# Patient Record
Sex: Male | Born: 1970 | Race: White | Hispanic: No | Marital: Married | Smoking: Never smoker
Health system: Southern US, Community
[De-identification: ages and names within clinical notes are randomized; demographics above are authoritative.]

## PROBLEM LIST (undated history)

## (undated) NOTE — ED Notes (Signed)
 Formatting of this note might be different from the original. Assumed care of pt from triage. Pt complaining of left flank pain since this morning, worsening approx 4 hours ago. Pt has had nausea and vomiting x2 hr. Pt has hx of kidney stones. Pt in no acute distress at this time. Pt A&Ox4. Call bell within reach. Daughter at bedside. IV and NS bolus initiated. Pain and nausea medications administered. Pt states his pain has improved 5 min after med admin. Electronically signed by Christian Lyle PARAS at 05/24/2016  9:28 PM EDT

## (undated) NOTE — ED Notes (Signed)
 Formatting of this note might be different from the original. Provider at bedside for dispo and follow up. Discharge plan reviewed and paperwork signed, pain level within manageable comfortable limits, IV removed, ambulatory to exit, gait steady, safety maintained.  Electronically signed by Christian Lyle PARAS at 05/24/2016 10:56 PM EDT

## (undated) NOTE — ED Notes (Signed)
 Formatting of this note might be different from the original. Dc home after instructions per PA.  Voiced no concerns or questions. Electronically signed by Adine Race E at 05/24/2016 10:59 PM EDT

## (undated) NOTE — ED Provider Notes (Signed)
 Formatting of this note is different from the original. Images from the original note were not included. [facility] EMERGENCY DEPARTMENT HISTORY AND PHYSICAL EXAM   Date of Service: 05/24/2016  Patient Name: Ricky Kennedy  Date of Birth: 04-19-1971 Medical Record Number: [id number]  History of Presenting Illness   Chief Complaint  Patient presents with  ? Flank Pain    pt ambulatory to triage with c/o L flank pain x this am; pt with hx of stones; pt states, I may have blood but I can't tell, and it feels like I have to pee all the time.; pt states N/V/D x 2 hours     History Provided By:  patient  Additional History:  Ricky Kennedy is a 1 y.o. male with PMhx significant for kidney stones who presents ambulatory to the ED with cc of 9/10 left flank pain and LLQ abdominal pain that began this morning but worsened over the past four-five hours. He reports additional sx of nausea and vomiting (x 2). He notes that he has a history of kidney stones and states that his sx today are similar. He denies fever.  There are no other complaints, changes or physical findings at this time.  Primary Care Provider: Phys Other, MD  Specialist:  Past History   Past Medical History:  No past medical history on file.   Past Surgical History:  No past surgical history on file.   Family History:  No family history on file.   Social History:  Social History  Substance Use Topics  ? Smoking status: Not on file  ? Smokeless tobacco: Not on file  ? Alcohol use Not on file    Allergies:  Allergies  Allergen Reactions  ? Codeine Hives    Review of Systems  Review of Systems  Constitutional: Negative for fatigue and fever.  HENT: Negative for congestion, ear pain and rhinorrhea.   Eyes: Negative for pain and redness.  Respiratory: Negative for cough and wheezing.   Cardiovascular: Negative for chest pain and palpitations.  Gastrointestinal: Positive for  abdominal pain, nausea and vomiting.  Genitourinary: Positive for flank pain. Negative for dysuria, frequency and urgency.  Musculoskeletal: Negative for back pain, neck pain and neck stiffness.  Skin: Negative for rash and wound.  Neurological: Negative for weakness, light-headedness, numbness and headaches.  All other systems reviewed and are negative.  Physical Exam Physical Exam  Constitutional: He is oriented to person, place, and time. He appears well-developed and well-nourished.  Non-toxic appearance. No distress.  HENT:  Head: Normocephalic and atraumatic. Head is without right periorbital erythema and without left periorbital erythema.  Right Ear: External ear normal.  Left Ear: External ear normal.  Nose: Nose normal.  Mouth/Throat: Uvula is midline. No trismus in the jaw.  Eyes: Conjunctivae and EOM are normal. Pupils are equal, round, and reactive to light. No scleral icterus.  Neck: Normal range of motion and full passive range of motion without pain.  Cardiovascular: Normal rate, regular rhythm and normal heart sounds.   Pulmonary/Chest: Effort normal and breath sounds normal. No accessory muscle usage. No tachypnea. No respiratory distress. He has no decreased breath sounds. He has no wheezes.  Abdominal: Soft. There is tenderness in the left lower quadrant. There is no rigidity and no guarding.  Musculoskeletal: Normal range of motion.  Positive for left flank tenderness   Neurological: He is alert and oriented to person, place, and time. He is not disoriented. No cranial nerve deficit or  sensory deficit. GCS eye subscore is 4. GCS verbal subscore is 5. GCS motor subscore is 6.  Skin: Skin is intact. No rash noted.  Psychiatric: He has a normal mood and affect. His speech is normal.  Nursing note and vitals reviewed.  Medical Decision Making  I am the first provider for this patient.   I reviewed the vital signs, available nursing notes, past medical history, past  surgical history, family history and social history.   Old Medical Records: Old medical records. Nursing notes.   Provider Notes:  DDx: UTI, pyelonephritis, kidney stones, acute kidney injury  ED Course: 9:29 PM  Initial assessment performed. The patients presenting problems have been discussed, and they are in agreement with the care plan formulated and outlined with them.  I have encouraged them to ask questions as they arise throughout their visit.  Progress Notes:  10:53 PM The pt has been tolerating PO (water) throughout his stay in the ED tonight.   10:54 PM Ricky Kennedy's final results have been reviewed with him.  He has been counseled regarding his diagnosis.  He verbally conveys understanding and agreement of the signs, symptoms, diagnosis, treatment and prognosis .   Diagnostic Study Results  Labs -   Recent Results (from the past 12 hour(s))  URINALYSIS W/ REFLEX CULTURE   Collection Time: 05/24/16  7:54 PM  Result Value Ref Range   Color YELLOW/STRAW     Appearance CLOUDY (A) CLEAR     Specific gravity 1.018 1.003 - 1.030     pH (UA) 7.0 5.0 - 8.0     Protein 30 (A) NEG mg/dL   Glucose NEGATIVE  NEG mg/dL   Ketone NEGATIVE  NEG mg/dL   Bilirubin NEGATIVE  NEG     Blood LARGE (A) NEG     Urobilinogen 0.2 0.2 - 1.0 EU/dL   Nitrites NEGATIVE  NEG     Leukocyte Esterase NEGATIVE  NEG     WBC 0-4 0 - 4 /hpf   RBC >100 (H) 0 - 5 /hpf   Epithelial cells FEW FEW /lpf   Bacteria NEGATIVE  NEG /hpf   UA:UC IF INDICATED CULTURE NOT INDICATED BY UA RESULT CNI     Hyaline cast 0-2 0 - 5 /lpf  CBC WITH AUTOMATED DIFF   Collection Time: 05/24/16  8:09 PM  Result Value Ref Range   WBC 15.9 (H) 4.1 - 11.1 K/uL   RBC 4.55 4.10 - 5.70 M/uL   HGB 13.7 12.1 - 17.0 g/dL   HCT 60.6 63.3 - 49.6 %   MCV 86.4 80.0 - 99.0 FL   MCH 30.1 26.0 - 34.0 PG   MCHC 34.9 30.0 - 36.5 g/dL   RDW 87.2 88.4 - 85.4 %   PLATELET 240 150 - 400 K/uL   NEUTROPHILS 91 (H) 32 - 75 %    LYMPHOCYTES 6 (L) 12 - 49 %   MONOCYTES 3 (L) 5 - 13 %   EOSINOPHILS 0 0 - 7 %   BASOPHILS 0 0 - 1 %   ABS. NEUTROPHILS 14.5 (H) 1.8 - 8.0 K/UL   ABS. LYMPHOCYTES 0.9 0.8 - 3.5 K/UL   ABS. MONOCYTES 0.5 0.0 - 1.0 K/UL   ABS. EOSINOPHILS 0.0 0.0 - 0.4 K/UL   ABS. BASOPHILS 0.0 0.0 - 0.1 K/UL  METABOLIC PANEL, COMPREHENSIVE   Collection Time: 05/24/16  8:09 PM  Result Value Ref Range   Sodium 139 136 - 145 mmol/L   Potassium 3.6 3.5 -  5.1 mmol/L   Chloride 106 97 - 108 mmol/L   CO2 26 21 - 32 mmol/L   Anion gap 7 5 - 15 mmol/L   Glucose 116 (H) 65 - 100 mg/dL   BUN 18 6 - 20 MG/DL   Creatinine 8.65 (H) 9.29 - 1.30 MG/DL   BUN/Creatinine ratio 13 12 - 20     GFR est AA >60 >60 ml/min/1.94m2   GFR est non-AA 57 (L) >60 ml/min/1.80m2   Calcium 8.7 8.5 - 10.1 MG/DL   Bilirubin, total 0.4 0.2 - 1.0 MG/DL   ALT (SGPT) 95 (H) 12 - 78 U/L   AST (SGOT) 61 (H) 15 - 37 U/L   Alk. phosphatase 90 45 - 117 U/L   Protein, total 8.6 (H) 6.4 - 8.2 g/dL   Albumin 4.6 3.5 - 5.0 g/dL   Globulin 4.0 2.0 - 4.0 g/dL   A-G Ratio 1.2 1.1 - 2.2     Radiologic Studies - The following have been ordered and reviewed: CT ABD PELV WO CONT  Final Result    CT Results  (Last 48 hours)    05/24/16 2210  CT ABD PELV WO CONT Final result   Impression:  IMPRESSION:   1. Left hydroureteronephrosis with a 5.5 mm calculus in the distal left ureter.  2. Bilateral nephrolithiasis.  3. Hepatic steatosis.     Narrative:  EXAM:  CT ABD PELV WO CONT   INDICATION: Flank pain, stone disease suspected   COMPARISON: None   CONTRAST:  None.   TECHNIQUE:   Thin axial images were obtained through the abdomen and pelvis. Coronal and  sagittal reconstructions were generated. Oral contrast was not administered. CT  dose reduction was achieved through use of a standardized protocol tailored for  this examination and automatic exposure control for dose modulation.    The absence of intravenous contrast material  reduces the sensitivity for  evaluation of the solid parenchymal organs of the abdomen.    FINDINGS:   LUNG BASES: Mild hypoventilatory changes.  INCIDENTALLY IMAGED HEART AND MEDIASTINUM: Unremarkable.  LIVER: Hepatic steatosis.  GALLBLADDER: Unremarkable.  SPLEEN: No mass.  PANCREAS: No mass or ductal dilatation.  ADRENALS: Unremarkable.  KIDNEYS/URETERS: Bilateral nephrolithiasis. Left hydroureteronephrosis with a  5.5 mm calculus in the distal left ureter.  STOMACH: Unremarkable.  SMALL BOWEL: No dilatation or wall thickening.  COLON: No dilatation or wall thickening.  APPENDIX: Unremarkable.  PERITONEUM: No ascites or pneumoperitoneum.  RETROPERITONEUM: No lymphadenopathy or aortic aneurysm.  REPRODUCTIVE ORGANS: Normal.  URINARY BLADDER: No mass or calculus.  BONES: No destructive bone lesion.  ADDITIONAL COMMENTS: N/A      Vital Signs-Reviewed the patient's vital signs.  Patient Vitals for the past 12 hrs:  Temp Pulse Resp BP SpO2  05/24/16 1943 97.1 F (36.2 C) (!) 50 18 (!) 154/91 99 %   Medications Given in the ED: Medications  HYDROmorphone (PF) (DILAUDID) injection 1 mg (1 mg IntraVENous Given 05/24/16 2119)  ketorolac  (TORADOL ) injection 30 mg (30 mg IntraVENous Given 05/24/16 2119)  ondansetron (ZOFRAN) injection 4 mg (4 mg IntraVENous Given 05/24/16 2118)  sodium chloride  0.9 % bolus infusion 1,000 mL (0 mL IntraVENous IV Completed 05/24/16 2224)   Diagnosis: Clinical Impression:  1. Ureterolithiasis   2. Impaired renal function     Plan: 1:  Follow-up Information    Follow up With Details Comments Contact Info   Camellia SHAUNNA Olp, MD Schedule an appointment as soon as possible for a visit UROLOGY: call to  schedule follow up [address] [place] [address] [phone]    2:  Discharge Medication List as of 05/24/2016 10:56 PM   START taking these medications   Details  oxyCODONE -acetaminophen  (PERCOCET) 5-325 mg per tablet  Take 1 Tab by mouth every four (4) hours as needed for Pain. Max Daily Amount: 6 Tabs., Print, Disp-20 Tab, R-0   ibuprofen (MOTRIN) 800 mg tablet Take 1 Tab by mouth every eight (8) hours as needed for Pain for up to 7 days., Print, Disp-20 Tab, R-0   ondansetron (ZOFRAN ODT) 4 mg disintegrating tablet Take 1 Tab by mouth every eight (8) hours as needed for Nausea., Print, Disp-20 Tab, R-0   tamsulosin (FLOMAX) 0.4 mg capsule Take 1 Cap by mouth daily for 7 doses., Print, Disp-7 Cap, R-0     Return to ED if worse.   Disposition: DISCHARGE NOTE 10:54 PM The patient has been re-evaluated and is ready for discharge. Reviewed available results with patient. Counseled pt on diagnosis and care plan. Pt has expressed understanding, and all questions have been answered. Pt agrees with plan and agrees to F/U as recommended, or return to the ED if their sxs worsen. Discharge instructions have been provided and explained to the pt, along with reasons to return to the ED.  This note is prepared by Simmie Bathe, acting as Scribe for [employer].  PA-C Artist Molt: The scribe's documentation has been prepared under my direction and personally reviewed by me in its entirety. I confirm that the note above accurately reflects all work, treatment, procedures, and medical decision making performed by me.   Electronically signed by Holli Delon CROME., MD at 05/25/2016  1:15 AM EDT  Associated attestation - Termeer, Delon CROME., MD - 05/25/2016  1:15 AM EDT Formatting of this note might be different from the original. 1:15 AM I was personally available for consultation in the emergency department.  I have reviewed the chart and agree with the documentation recorded by the [facility], including the assessment, treatment plan, and disposition. Jennifer L. Termeer, MD

---

## 2014-08-03 NOTE — ED Provider Notes (Signed)
 [facility] EMERGENCY DEPARTMENT  (K59.00) Constipation, unspecified constipation type  Assessment/Differential Diagnosis:  Likely rectal impaction. Pressure in rectal areas. No BMs, on narcotic for pain from kidney stone. Soft stool impaction on exam.   ED Course/Medical Decision Making:  12:43 AM Pt had BM. Feels much improved.   SABRA  .Pre-Hospital/Procedures/Consults:  None  .Disposition:  Home  New Prescriptions   No medications on file   Chief Complaint  Patient presents with  . RECTAL PAIN    HPI Comments: Ricky Kennedy is a 43 y.o. male who presents to ED c/o constipation, rectal pressure, blood per rectum, low abd cramping since 6:30 PM tonight. Last nl BM was yesterday, sts he has difficulty with BMs for the last 10 days. At 6:30 PM today pt noted onset of sensation of needing to have BM with rectal pressure but was unable to have BM - sts he remained on the commode for about 3 hours attempting to have BM with no results. Has had blood per rectum in the last hour and admits to diffuse lower abd cramping. Pt also describes sensation of something out of me that's never been out of me before. Pt passed kidney stone recently, on 07/29/2014 had onset of neck pain and stiffness and began taking narcotic rx leftover from kidney stone for this. Pt tried drinking epsom salt, OTC stool softeners with no improvement. Today pt ate ham, [place], bread, cereal, coffee, ice cream. Denies nausea, vomiting, fevers, any other sxs. No h/o similar.   The history is provided by the patient. No language interpreter was used.    Review of Systems: Constitutional: Negative for fever and diaphoresis.  HENT: Negative for neck pain and neck stiffness.   Respiratory: Negative for cough and shortness of breath.   Cardiovascular: Negative for chest pain and palpitations.  Gastrointestinal: Positive for abdominal pain and constipation. Negative for nausea and vomiting.       +rectal pressure. +blood  per rectum  Musculoskeletal: Negative for myalgias and back pain.  Skin: Negative for color change and rash.  Neurological: Negative for dizziness, weakness and headaches.  Psychiatric/Behavioral: Negative for substance abuse. The patient is not nervous/anxious.   All other systems reviewed and are negative.   Physical Exam  Constitutional: He is well-developed, well-nourished, and in no distress.  HENT:  Mouth/Throat: Oropharynx is clear and moist.  Eyes: Pupils are equal, round, and reactive to light. No scleral icterus.  Neck: Normal range of motion. Neck supple.  Cardiovascular: Normal rate, regular rhythm and intact distal pulses.  Exam reveals no gallop and no friction rub.   No murmur heard. Pulmonary/Chest: Effort normal and breath sounds normal.  Abdominal: Soft. Bowel sounds are normal. He exhibits no mass. There is tenderness (mild lower).  Genitourinary:  Large amount of soft stool in rectum  Musculoskeletal: He exhibits no edema or tenderness.  Lymphadenopathy:    He has no cervical adenopathy.  Neurological: He is alert. He exhibits normal muscle tone.  Skin: Skin is warm and dry. No rash noted.  Nursing note and vitals reviewed.   Past Medical History  Diagnosis Date  . Kidney stone    History reviewed. No pertinent past surgical history. No family history on file. History   Social History  . Marital Status: Married    Spouse Name: N/A    Number of Children: N/A  . Years of Education: N/A   Occupational History  . Not on file.   Social History Main Topics  . Smoking status:  Never Smoker   . Smokeless tobacco: Never Used  . Alcohol Use: No  . Drug Use: No  . Sexual Activity: Not on file   Other Topics Concern  . Not on file   Social History Narrative   No outpatient prescriptions have been marked as taking for the 08/03/14 encounter [facility] Encounter).   Allergies  Allergen Reactions  . Codeine mild rash/itching    Vital Signs: Patient  Vitals for the past 72 hrs:  Temp Heart Rate Resp BP BP Mean SpO2 Weight  08/03/14 2259 98.1 F (36.7 C) 66 15 166/110 mmHg 129 MM HG 99 % 90.719 kg (200 lb)     Documentation Review:  Old medical records, Nursing notes  Diagnostics: Labs:  No results found for this visit on 08/03/14. ECG: No results found for this visit on 08/03/14.  Rhythm interpretation from monitor: N/A  No orders to display   This chart is partially documented by Chiquita FORBES Sharps acting as a scribe for Clotilda CHRISTELLA Prows, MD.

## 2015-02-26 ENCOUNTER — Emergency Department (HOSPITAL_COMMUNITY): Payer: BLUE CROSS/BLUE SHIELD

## 2015-02-26 ENCOUNTER — Encounter (HOSPITAL_COMMUNITY): Payer: Self-pay | Admitting: Emergency Medicine

## 2015-02-26 ENCOUNTER — Emergency Department (HOSPITAL_COMMUNITY)
Admission: EM | Admit: 2015-02-26 | Discharge: 2015-02-26 | Disposition: A | Discharge status: Home / Self Care | Payer: BLUE CROSS/BLUE SHIELD | Attending: Emergency Medicine | Admitting: Emergency Medicine

## 2015-02-26 DIAGNOSIS — Y998 Other external cause status: Secondary | ICD-10-CM | POA: Insufficient documentation

## 2015-02-26 DIAGNOSIS — Y92481 Parking lot as the place of occurrence of the external cause: Secondary | ICD-10-CM | POA: Insufficient documentation

## 2015-02-26 DIAGNOSIS — S9305XA Dislocation of left ankle joint, initial encounter: Secondary | ICD-10-CM | POA: Diagnosis not present

## 2015-02-26 DIAGNOSIS — S82852A Displaced trimalleolar fracture of left lower leg, initial encounter for closed fracture: Secondary | ICD-10-CM | POA: Diagnosis not present

## 2015-02-26 DIAGNOSIS — Y9389 Activity, other specified: Secondary | ICD-10-CM | POA: Diagnosis not present

## 2015-02-26 DIAGNOSIS — S99912A Unspecified injury of left ankle, initial encounter: Secondary | ICD-10-CM | POA: Diagnosis present

## 2015-02-26 DIAGNOSIS — R52 Pain, unspecified: Secondary | ICD-10-CM

## 2015-02-26 MED ORDER — SODIUM CHLORIDE 0.9 % IV SOLN
INTRAVENOUS | Status: DC | PRN
  Administered 2015-02-26: 125 mL/h via INTRAVENOUS

## 2015-02-26 MED ORDER — PROPOFOL 10 MG/ML IV BOLUS
INTRAVENOUS | Status: DC | PRN
  Administered 2015-02-26: 40 mg via INTRAVENOUS

## 2015-02-26 MED ORDER — OXYCODONE-ACETAMINOPHEN 5-325 MG PO TABS: 1.0000 | ORAL_TABLET | ORAL | Status: AC | PRN

## 2015-02-26 MED ORDER — NAPROXEN 500 MG PO TABS: ORAL_TABLET | ORAL | Status: AC

## 2015-02-26 MED ORDER — FENTANYL CITRATE (PF) 100 MCG/2ML IJ SOLN
50.0000 ug | Freq: Once | INTRAMUSCULAR | Status: AC
  Administered 2015-02-26: 50 ug via INTRAVENOUS
  Filled 2015-02-26: qty 2

## 2015-02-26 MED ORDER — FENTANYL CITRATE (PF) 100 MCG/2ML IJ SOLN
50.0000 ug | Freq: Once | INTRAMUSCULAR | Status: DC
  Filled 2015-02-26: qty 2

## 2015-02-26 MED ORDER — PROPOFOL 10 MG/ML IV BOLUS
200.0000 mg | Freq: Once | INTRAVENOUS | Status: DC
  Filled 2015-02-26: qty 20

## 2015-02-26 MED ORDER — FENTANYL CITRATE (PF) 100 MCG/2ML IJ SOLN
50.0000 ug | Freq: Once | INTRAMUSCULAR | Status: AC
  Administered 2015-02-26: 50 ug via INTRAVENOUS

## 2015-02-26 MED ORDER — KETOROLAC TROMETHAMINE 30 MG/ML IJ SOLN
30.0000 mg | Freq: Once | INTRAMUSCULAR | Status: AC
  Administered 2015-02-26: 30 mg via INTRAVENOUS
  Filled 2015-02-26: qty 1

## 2015-02-26 NOTE — Discharge Instructions (Signed)
Elevate your foot. Use ice packs to keep the swelling down. Do not bear weight on your left leg. Use the crutches to walk. Leave the splint in place until you see your orthopedist at home. Call his office on Monday. You had a "trimalleolar fracture/dislocation" of your left ankle and you will need surgery to have it repaired properly. Take the CD of your xrays with you when you go to see him.  Cryotherapy Cryotherapy means treatment with cold. Ice or gel packs can be used to reduce both pain and swelling. Ice is the most helpful within the first 24 to 48 hours after an injury or flare-up from overusing a muscle or joint. Sprains, strains, spasms, burning pain, shooting pain, and aches can all be eased with ice. Ice can also be used when recovering from surgery. Ice is effective, has very few side effects, and is safe for most people to use. PRECAUTIONS  Ice is not a safe treatment option for people with:  Raynaud phenomenon. This is a condition affecting small blood vessels in the extremities. Exposure to cold may cause your problems to return.  Cold hypersensitivity. There are many forms of cold hypersensitivity, including:  Cold urticaria. Red, itchy hives appear on the skin when the tissues begin to warm after being iced.  Cold erythema. This is a red, itchy rash caused by exposure to cold.  Cold hemoglobinuria. Red blood cells break down when the tissues begin to warm after being iced. The hemoglobin that carry oxygen are passed into the urine because they cannot combine with blood proteins fast enough.  Numbness or altered sensitivity in the area being iced. If you have any of the following conditions, do not use ice until you have discussed cryotherapy with your caregiver:  Heart conditions, such as arrhythmia, angina, or chronic heart disease.  High blood pressure.  Healing wounds or open skin in the area being iced.  Current infections.  Rheumatoid arthritis.  Poor  circulation.  Diabetes. Ice slows the blood flow in the region it is applied. This is beneficial when trying to stop inflamed tissues from spreading irritating chemicals to surrounding tissues. However, if you expose your skin to cold temperatures for too long or without the proper protection, you can damage your skin or nerves. Watch for signs of skin damage due to cold. HOME CARE INSTRUCTIONS Follow these tips to use ice and cold packs safely.  Place a dry or damp towel between the ice and skin. A damp towel will cool the skin more quickly, so you may need to shorten the time that the ice is used.  For a more rapid response, add gentle compression to the ice.  Ice for no more than 10 to 20 minutes at a time. The bonier the area you are icing, the less time it will take to get the benefits of ice.  Check your skin after 5 minutes to make sure there are no signs of a poor response to cold or skin damage.  Rest 20 minutes or more between uses.  Once your skin is numb, you can end your treatment. You can test numbness by very lightly touching your skin. The touch should be so light that you do not see the skin dimple from the pressure of your fingertip. When using ice, most people will feel these normal sensations in this order: cold, burning, aching, and numbness.  Do not use ice on someone who cannot communicate their responses to pain, such as small children or  people with dementia. HOW TO MAKE AN ICE PACK Ice packs are the most common way to use ice therapy. Other methods include ice massage, ice baths, and cryosprays. Muscle creams that cause a cold, tingly feeling do not offer the same benefits that ice offers and should not be used as a substitute unless recommended by your caregiver. To make an ice pack, do one of the following:  Place crushed ice or a bag of frozen vegetables in a sealable plastic bag. Squeeze out the excess air. Place this bag inside another plastic bag. Slide the bag  into a pillowcase or place a damp towel between your skin and the bag.  Mix 3 parts water with 1 part rubbing alcohol. Freeze the mixture in a sealable plastic bag. When you remove the mixture from the freezer, it will be slushy. Squeeze out the excess air. Place this bag inside another plastic bag. Slide the bag into a pillowcase or place a damp towel between your skin and the bag. SEEK MEDICAL CARE IF:  You develop white spots on your skin. This may give the skin a blotchy (mottled) appearance.  Your skin turns blue or pale.  Your skin becomes waxy or hard.  Your swelling gets worse. MAKE SURE YOU:   Understand these instructions.  Will watch your condition.  Will get help right away if you are not doing well or get worse. Document Released: 03/18/2011 Document Revised: 12/06/2013 Document Reviewed: 03/18/2011 [facility] Patient Information 2015 [place], [place]. This information is not intended to replace advice given to you by your health care provider. Make sure you discuss any questions you have with your health care provider.  Cast or Splint Care Casts and splints support injured limbs and keep bones from moving while they heal.  HOME CARE  Keep the cast or splint uncovered during the drying period.  A plaster cast can take 24 to 48 hours to dry.  A fiberglass cast will dry in less than 1 hour.  Do not rest the cast on anything harder than a pillow for 24 hours.  Do not put weight on your injured limb. Do not put pressure on the cast. Wait for your doctor's approval.  Keep the cast or splint dry.  Cover the cast or splint with a plastic bag during baths or wet weather.  If you have a cast over your chest and belly (trunk), take sponge baths until the cast is taken off.  If your cast gets wet, dry it with a towel or blow dryer. Use the cool setting on the blow dryer.  Keep your cast or splint clean. Wash a dirty cast with a damp cloth.  Do not put any objects under  your cast or splint.  Do not scratch the skin under the cast with an object. If itching is a problem, use a blow dryer on a cool setting over the itchy area.  Do not trim or cut your cast.  Do not take out the padding from inside your cast.  Exercise your joints near the cast as told by your doctor.  Raise (elevate) your injured limb on 1 or 2 pillows for the first 1 to 3 days. GET HELP IF:  Your cast or splint cracks.  Your cast or splint is too tight or too loose.  You itch badly under the cast.  Your cast gets wet or has a soft spot.  You have a bad smell coming from the cast.  You get an object stuck  under the cast.  Your skin around the cast becomes red or sore.  You have new or more pain after the cast is put on. GET HELP RIGHT AWAY IF:  You have fluid leaking through the cast.  You cannot move your fingers or toes.  Your fingers or toes turn blue or white or are cool, painful, or puffy (swollen).  You have tingling or lose feeling (numbness) around the injured area.  You have bad pain or pressure under the cast.  You have trouble breathing or have shortness of breath.  You have chest pain. Document Released: 11/21/2010 Document Revised: 03/24/2013 Document Reviewed: 01/28/2013 [facility] Patient Information 2015 [place], [place]. This information is not intended to replace advice given to you by your health care provider. Make sure you discuss any questions you have with your health care provider.  Ankle Fracture A fracture is a break in a bone. The ankle joint is made up of three bones. These include the lower (distal)sections of your lower leg bones, called the tibia and fibula, along with a bone in your foot, called the talus. Depending on how bad the break is and if more than one ankle joint bone is broken, a cast or splint is used to protect and keep your injured bone from moving while it heals. Sometimes, surgery is required to help the fracture heal  properly.  There are two general types of fractures:  Stable fracture. This includes a single fracture line through one bone, with no injury to ankle ligaments. A fracture of the talus that does not have any displacement (movement of the bone on either side of the fracture line) is also stable.  Unstable fracture. This includes more than one fracture line through one or more bones in the ankle joint. It also includes fractures that have displacement of the bone on either side of the fracture line. CAUSES  A direct blow to the ankle.   Quickly and severely twisting your ankle.  Trauma, such as a car accident or falling from a significant height. RISK FACTORS You may be at a higher risk of ankle fracture if:  You have certain medical conditions.  You are involved in high-impact sports.  You are involved in a high-impact car accident. SIGNS AND SYMPTOMS   Tender and swollen ankle.  Bruising around the injured ankle.  Pain on movement of the ankle.  Difficulty walking or putting weight on the ankle.  A cold foot below the site of the ankle injury. This can occur if the blood vessels passing through your injured ankle were also damaged.  Numbness in the foot below the site of the ankle injury. DIAGNOSIS  An ankle fracture is usually diagnosed with a physical exam and X-rays. A CT scan may also be required for complex fractures. TREATMENT  Stable fractures are treated with a cast or splint and using crutches to avoid putting weight on your injured ankle. This is followed by an ankle strengthening program. Some patients require a special type of cast, depending on other medical problems they may have. Unstable fractures require surgery to ensure the bones heal properly. Your health care provider will tell you what type of fracture you have and the best treatment for your condition. HOME CARE INSTRUCTIONS   Review correct crutch use with your health care provider and use your  crutches as directed. Safe use of crutches is extremely important. Misuse of crutches can cause you to fall or cause injury to nerves in your hands or  armpits.  Do not put weight or pressure on the injured ankle until directed by your health care provider.  To lessen the swelling, keep the injured leg elevated while sitting or lying down.  Apply ice to the injured area:  Put ice in a plastic bag.  Place a towel between your cast and the bag.  Leave the ice on for 20 minutes, 2-3 times a day.  If you have a plaster or fiberglass cast:  Do not try to scratch the skin under the cast with any objects. This can increase your risk of skin infection.  Check the skin around the cast every day. You may put lotion on any red or sore areas.  Keep your cast dry and clean.  If you have a plaster splint:  Wear the splint as directed.  You may loosen the elastic around the splint if your toes become numb, tingle, or turn cold or blue.  Do not put pressure on any part of your cast or splint; it may break. Rest your cast only on a pillow the first 24 hours until it is fully hardened.  Your cast or splint can be protected during bathing with a plastic bag sealed to your skin with medical tape. Do not lower the cast or splint into water.  Take medicines as directed by your health care provider. Only take over-the-counter or prescription medicines for pain, discomfort, or fever as directed by your health care provider.  Do not drive a vehicle until your health care provider specifically tells you it is safe to do so.  If your health care provider has given you a follow-up appointment, it is very important to keep that appointment. Not keeping the appointment could result in a chronic or permanent injury, pain, and disability. If you have any problem keeping the appointment, call the facility for assistance. SEEK MEDICAL CARE IF: You develop increased swelling or discomfort. SEEK IMMEDIATE MEDICAL  CARE IF:   Your cast gets damaged or breaks.  You have continued severe pain.  You develop new pain or swelling after the cast was put on.  Your skin or toenails below the injury turn blue or gray.  Your skin or toenails below the injury feel cold, numb, or have loss of sensitivity to touch.  There is a bad smell or pus draining from under the cast. MAKE SURE YOU:   Understand these instructions.  Will watch your condition.  Will get help right away if you are not doing well or get worse. Document Released: 07/19/2000 Document Revised: 07/27/2013 Document Reviewed: 02/18/2013 [facility] Patient Information 2015 [place], [place]. This information is not intended to replace advice given to you by your health care provider. Make sure you discuss any questions you have with your health care provider.  Ankle Dislocation Ankle dislocation is the displacement of the bones that form your ankle joint. The ankle joint is designed for a balance of stability and flexibility. The bones of the ankle are held in place by very strong, fibrous tissues (ligaments) that connect the bones to each other. CAUSES Because the ankle is a very strong and stable joint, ankle dislocation is only caused by a very forceful injury. Typically, injuries that contribute to ankle dislocation include broken bones (fractures) on the inside and outside of the ankle (malleoli).  RELATED COMPLICATIONS Ankle dislocation can lead to more serious complications. Examples of complications associated with ankle dislocation include:  Injury to the strong fibrous tissues that connect muscles to bones (tendons).  Injury to the flexible tissue that cushions the bones in the joint (cartilage). This can lead to the development of arthritis, loss of joint motion, and pain.  Injury to the nerves and blood vessels that cross the ankle. Blood vessel damage may result in bone death of the top bone of the foot (talus).  Skin over the  dislocated area being torn (lacerated) or damaged by pressure from the dislocated bones.  Swelling of compartments in the foot (rare). This may damage blood supply to the muscles (compartment syndrome). RISK FACTORS Although dislocation of the ankle can occur in anyone, some people are at greater risk than others. People at increased risk of ankle dislocation include:  Young males. This may be related to their overall increased risk of injury.  Postmenopausal women. This may be related to their increased risk of bone fracture because of the weakening of the bones that occurs in women in this age group (osteoporosis).  People born with greater looseness (elasticity) in their ligaments. SYMPTOMS Symptoms of ankle dislocation include:  Severe pain.  Swelling.  Deformity around the ankle.  Whitening or laceration of the skin. DIAGNOSIS  A physical exam and an X-ray exam are usually done to help your caregiver diagnose ankle dislocation. TREATMENT Treatment may include:  Manipulation of the ankle by your caregiver to put your ankle back in place (reduction).  Repair of any associated skin lacerations.  Plates and screws used to stabilize the fractures and hold the joint in position after reduction.  Pins drilled into your bones that are connected to bars outside of your skin (external fixator) used to hold your ankle in a fixed position until the swelling in your ankle goes down enough for surgery to be done.  Placement of a cast or splint to allow torn ligaments to heal.  Physical therapy to regain ankle motion and leg strength. HOME CARE INSTRUCTIONS The following measures can help to reduce pain and hasten the healing process:  Rest your injured joint. Do not move it. Avoid activities similar to the one that caused your injury.  Apply ice to your injured joint for 1 to 2 days after your reduction or as directed by your caregiver. Applying ice helps to reduce inflammation and  pain.  Put ice in a plastic bag.  Place a towel between your skin and the bag.  Leave the ice on for 15 to 20 minutes at a time, every couple of hours while you are awake.  Elevate your ankle above your heart to minimize swelling.  Move your toes as instructed by your caregiver to prevent stiffness.  Take over-the-counter or prescription medicines for pain as directed by your caregiver. SEEK IMMEDIATE MEDICAL CARE IF:  Your cast, splint, screws, plates, or external fixator becomes loose or damaged.  You have an external fixator and you notice fluids draining around the pins.  Your pain becomes worse rather than better.  You lose feeling in your toe or cannot bend the tip of your toe. MAKE SURE YOU:  Understand these instructions.  Will watch your condition.  Will get help right away if you are not doing well or get worse. Document Released: 07/22/2005 Document Revised: 10/14/2011 Document Reviewed: 12/20/2010 [facility] Patient Information 2015 [place], [place]. This information is not intended to replace advice given to you by your health care provider. Make sure you discuss any questions you have with your health care provider.
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
## 2016-05-24 ENCOUNTER — Inpatient Hospital Stay
Admit: 2016-05-24 | Discharge: 2016-05-25 | Disposition: A | Discharge status: Home / Self Care | Payer: PRIVATE HEALTH INSURANCE | Attending: Emergency Medicine

## 2016-05-24 ENCOUNTER — Emergency Department: Admit: 2016-05-25 | Payer: PRIVATE HEALTH INSURANCE | Primary: Family Medicine

## 2016-05-24 DIAGNOSIS — N201 Calculus of ureter: Secondary | ICD-10-CM

## 2016-05-24 NOTE — ED Provider Notes (Signed)
[facility]  EMERGENCY DEPARTMENT HISTORY AND PHYSICAL EXAM         Date of Service: 05/24/2016   Patient Name: Jeffrey Cannon   Date of Birth: 1971/04/14  Medical Record Number: [id number]    History of Presenting Illness     Chief Complaint   Patient presents with   ??? Flank Pain     pt ambulatory to triage with c/o L flank pain x this am; pt with hx of stones; pt states, "I may have blood but I can't tell, and it feels like I have to pee all the time."; pt states N/V/D x 2 hours         History Provided By:  patient    Additional History:   Jeffrey Cannon is a 45 y.o. male with PMhx significant for kidney stones who presents ambulatory to the ED with cc of 9/10 left flank pain and LLQ abdominal pain that began this morning but worsened over the past four-five hours. He reports additional sx of nausea and vomiting (x 2). He notes that he has a history of kidney stones and states that his sx today are similar. He denies fever.    There are no other complaints, changes or physical findings at this time.    Primary Care Provider: Phys Other, MD   Specialist:    Past History     Past Medical History:   No past medical history on file.     Past Surgical History:   No past surgical history on file.     Family History:   No family history on file.     Social History:   Social History   Substance Use Topics   ??? Smoking status: Not on file   ??? Smokeless tobacco: Not on file   ??? Alcohol use Not on file        Allergies:   Allergies   Allergen Reactions   ??? Codeine Hives        Review of Systems   Review of Systems   Constitutional: Negative for fatigue and fever.   HENT: Negative for congestion, ear pain and rhinorrhea.    Eyes: Negative for pain and redness.   Respiratory: Negative for cough and wheezing.    Cardiovascular: Negative for chest pain and palpitations.   Gastrointestinal: Positive for abdominal pain, nausea and vomiting.    Genitourinary: Positive for flank pain. Negative for dysuria, frequency and urgency.   Musculoskeletal: Negative for back pain, neck pain and neck stiffness.   Skin: Negative for rash and wound.   Neurological: Negative for weakness, light-headedness, numbness and headaches.   All other systems reviewed and are negative.      Physical Exam  Physical Exam   Constitutional: He is oriented to person, place, and time. He appears well-developed and well-nourished.  Non-toxic appearance. No distress.   HENT:   Head: Normocephalic and atraumatic. Head is without right periorbital erythema and without left periorbital erythema.   Right Ear: External ear normal.   Left Ear: External ear normal.   Nose: Nose normal.   Mouth/Throat: Uvula is midline. No trismus in the jaw.   Eyes: Conjunctivae and EOM are normal. Pupils are equal, round, and reactive to light. No scleral icterus.   Neck: Normal range of motion and full passive range of motion without pain.   Cardiovascular: Normal rate, regular rhythm and normal heart sounds.    Pulmonary/Chest: Effort normal and breath sounds normal. No accessory  muscle usage. No tachypnea. No respiratory distress. He has no decreased breath sounds. He has no wheezes.   Abdominal: Soft. There is tenderness in the left lower quadrant. There is no rigidity and no guarding.   Musculoskeletal: Normal range of motion.   Positive for left flank tenderness    Neurological: He is alert and oriented to person, place, and time. He is not disoriented. No cranial nerve deficit or sensory deficit. GCS eye subscore is 4. GCS verbal subscore is 5. GCS motor subscore is 6.   Skin: Skin is intact. No rash noted.   Psychiatric: He has a normal mood and affect. His speech is normal.   Nursing note and vitals reviewed.      Medical Decision Making   I am the first provider for this patient.     I reviewed the vital signs, available nursing notes, past medical history,  past surgical history, family history and social history.     Old Medical Records: Old medical records.  Nursing notes.     Provider Notes:   DDx: UTI, pyelonephritis, kidney stones, acute kidney injury    ED Course:  9:29 PM   Initial assessment performed. The patients presenting problems have been discussed, and they are in agreement with the care plan formulated and outlined with them.  I have encouraged them to ask questions as they arise throughout their visit.    Progress Notes:   10:53 PM  The pt has been tolerating PO (water) throughout his stay in the ED tonight.     10:54 PM  Loretta Stanczyk's final results have been reviewed with him.  He has been counseled regarding his diagnosis.  He verbally conveys understanding and agreement of the signs, symptoms, diagnosis, treatment and prognosis .     Diagnostic Study Results   Labs -      Recent Results (from the past 12 hour(s))   URINALYSIS W/ REFLEX CULTURE    Collection Time: 05/24/16  7:54 PM   Result Value Ref Range    Color YELLOW/STRAW      Appearance CLOUDY (A) CLEAR      Specific gravity 1.018 1.003 - 1.030      pH (UA) 7.0 5.0 - 8.0      Protein 30 (A) NEG mg/dL    Glucose NEGATIVE  NEG mg/dL    Ketone NEGATIVE  NEG mg/dL    Bilirubin NEGATIVE  NEG      Blood LARGE (A) NEG      Urobilinogen 0.2 0.2 - 1.0 EU/dL    Nitrites NEGATIVE  NEG      Leukocyte Esterase NEGATIVE  NEG      WBC 0-4 0 - 4 /hpf    RBC >100 (H) 0 - 5 /hpf    Epithelial cells FEW FEW /lpf    Bacteria NEGATIVE  NEG /hpf    UA:UC IF INDICATED CULTURE NOT INDICATED BY UA RESULT CNI      Hyaline cast 0-2 0 - 5 /lpf   CBC WITH AUTOMATED DIFF    Collection Time: 05/24/16  8:09 PM   Result Value Ref Range    WBC 15.9 (H) 4.1 - 11.1 K/uL    RBC 4.55 4.10 - 5.70 M/uL    HGB 13.7 12.1 - 17.0 g/dL    HCT 39.3 36.6 - 50.3 %    MCV 86.4 80.0 - 99.0 FL    MCH 30.1 26.0 - 34.0 PG    MCHC 34.9 30.0 - 36.5 g/dL  RDW 12.7 11.5 - 14.5 %    PLATELET 240 150 - 400 K/uL    NEUTROPHILS 91 (H) 32 - 75 %     LYMPHOCYTES 6 (L) 12 - 49 %    MONOCYTES 3 (L) 5 - 13 %    EOSINOPHILS 0 0 - 7 %    BASOPHILS 0 0 - 1 %    ABS. NEUTROPHILS 14.5 (H) 1.8 - 8.0 K/UL    ABS. LYMPHOCYTES 0.9 0.8 - 3.5 K/UL    ABS. MONOCYTES 0.5 0.0 - 1.0 K/UL    ABS. EOSINOPHILS 0.0 0.0 - 0.4 K/UL    ABS. BASOPHILS 0.0 0.0 - 0.1 K/UL   METABOLIC PANEL, COMPREHENSIVE    Collection Time: 05/24/16  8:09 PM   Result Value Ref Range    Sodium 139 136 - 145 mmol/L    Potassium 3.6 3.5 - 5.1 mmol/L    Chloride 106 97 - 108 mmol/L    CO2 26 21 - 32 mmol/L    Anion gap 7 5 - 15 mmol/L    Glucose 116 (H) 65 - 100 mg/dL    BUN 18 6 - 20 MG/DL    Creatinine 1.34 (H) 0.70 - 1.30 MG/DL    BUN/Creatinine ratio 13 12 - 20      GFR est AA >60 >60 ml/min/1.51m    GFR est non-AA 57 (L) >60 ml/min/1.712m   Calcium 8.7 8.5 - 10.1 MG/DL    Bilirubin, total 0.4 0.2 - 1.0 MG/DL    ALT (SGPT) 95 (H) 12 - 78 U/L    AST (SGOT) 61 (H) 15 - 37 U/L    Alk. phosphatase 90 45 - 117 U/L    Protein, total 8.6 (H) 6.4 - 8.2 g/dL    Albumin 4.6 3.5 - 5.0 g/dL    Globulin 4.0 2.0 - 4.0 g/dL    A-G Ratio 1.2 1.1 - 2.2         Radiologic Studies -  The following have been ordered and reviewed:  CT ABD PELV WO CONT   Final Result        CT Results  (Last 48 hours)               05/24/16 2210  CT ABD PELV WO CONT Final result    Impression:  IMPRESSION:       1. Left hydroureteronephrosis with a 5.5 mm calculus in the distal left ureter.   2. Bilateral nephrolithiasis.   3. Hepatic steatosis.           Narrative:  EXAM:  CT ABD PELV WO CONT       INDICATION: Flank pain, stone disease suspected       COMPARISON: None       CONTRAST:  None.       TECHNIQUE:    Thin axial images were obtained through the abdomen and pelvis. Coronal and   sagittal reconstructions were generated. Oral contrast was not administered. CT   dose reduction was achieved through use of a standardized protocol tailored for   this examination and automatic exposure control for dose modulation.         The absence of intravenous contrast material reduces the sensitivity for   evaluation of the solid parenchymal organs of the abdomen.        FINDINGS:    LUNG BASES: Mild hypoventilatory changes.   INCIDENTALLY IMAGED HEART AND MEDIASTINUM: Unremarkable.   LIVER: Hepatic steatosis.   GALLBLADDER: Unremarkable.  SPLEEN: No mass.   PANCREAS: No mass or ductal dilatation.   ADRENALS: Unremarkable.   KIDNEYS/URETERS: Bilateral nephrolithiasis. Left hydroureteronephrosis with a   5.5 mm calculus in the distal left ureter.   STOMACH: Unremarkable.   SMALL BOWEL: No dilatation or wall thickening.   COLON: No dilatation or wall thickening.   APPENDIX: Unremarkable.   PERITONEUM: No ascites or pneumoperitoneum.   RETROPERITONEUM: No lymphadenopathy or aortic aneurysm.   REPRODUCTIVE ORGANS: Normal.   URINARY BLADDER: No mass or calculus.   BONES: No destructive bone lesion.   ADDITIONAL COMMENTS: N/A                 Vital Signs-Reviewed the patient's vital signs.   Patient Vitals for the past 12 hrs:   Temp Pulse Resp BP SpO2   05/24/16 1943 97.1 ??F (36.2 ??C) (!) 50 18 (!) 154/91 99 %       Medications Given in the ED:  Medications   HYDROmorphone (PF) (DILAUDID) injection 1 mg (1 mg IntraVENous Given 05/24/16 2119)   ketorolac (TORADOL) injection 30 mg (30 mg IntraVENous Given 05/24/16 2119)   ondansetron (ZOFRAN) injection 4 mg (4 mg IntraVENous Given 05/24/16 2118)   sodium chloride 0.9 % bolus infusion 1,000 mL (0 mL IntraVENous IV Completed 05/24/16 2224)       Diagnosis:  Clinical Impression:   1. Ureterolithiasis    2. Impaired renal function         Plan:  1:   Follow-up Information     Follow up With Details Comments [place], MD Schedule an appointment as soon as possible for a visit UROLOGY: call to schedule follow up [address]  [place] [address]  [phone]            2:   Discharge Medication List as of 05/24/2016 10:56 PM      START taking these medications     Details   oxyCODONE-acetaminophen (PERCOCET) 5-325 mg per tablet Take 1 Tab by mouth every four (4) hours as needed for Pain. Max Daily Amount: 6 Tabs., Print, Disp-20 Tab, R-0      ibuprofen (MOTRIN) 800 mg tablet Take 1 Tab by mouth every eight (8) hours as needed for Pain for up to 7 days., Print, Disp-20 Tab, R-0      ondansetron (ZOFRAN ODT) 4 mg disintegrating tablet Take 1 Tab by mouth every eight (8) hours as needed for Nausea., Print, Disp-20 Tab, R-0      tamsulosin (FLOMAX) 0.4 mg capsule Take 1 Cap by mouth daily for 7 doses., Print, Disp-7 Cap, R-0           Return to ED if worse.     Disposition:  DISCHARGE NOTE  10:54 PM  The patient has been re-evaluated and is ready for discharge. Reviewed available results with patient. Counseled pt on diagnosis and care plan. Pt has expressed understanding, and all questions have been answered. Pt agrees with plan and agrees to F/U as recommended, or return to the ED if their sxs worsen. Discharge instructions have been provided and explained to the pt, along with reasons to return to the ED.    This note is prepared by Hanley Hays, acting as Scribe for [employer].    PA-C Debbra Riding: The scribe's documentation has been prepared under my direction and personally reviewed by me in its entirety. I confirm that the note above accurately reflects all work,  treatment, procedures, and medical decision making performed by me.

## 2016-05-24 NOTE — ED Notes (Signed)
Assumed care of pt from triage. Pt complaining of left flank pain since this morning, worsening approx 4 hours ago. Pt has had nausea and vomiting x2 hr. Pt has hx of kidney stones. Pt in no acute distress at this time. Pt A&Ox4. Call bell within reach. Daughter at bedside. IV and NS bolus initiated. Pain and nausea medications administered. Pt states his pain has improved 5 min after med admin.

## 2016-05-24 NOTE — ED Notes (Signed)
Dc home after instructions per PA.  Voiced no concerns or questions.

## 2016-05-24 NOTE — ED Notes (Signed)
Provider at bedside for dispo and follow up. Discharge plan reviewed and paperwork signed, pain level within manageable comfortable limits, IV removed, ambulatory to exit, gait steady, safety maintained.

## 2016-05-25 LAB — CBC WITH AUTOMATED DIFF
ABS. BASOPHILS: 0 10*3/uL (ref 0.0–0.1)
ABS. EOSINOPHILS: 0 10*3/uL (ref 0.0–0.4)
ABS. LYMPHOCYTES: 0.9 10*3/uL (ref 0.8–3.5)
ABS. MONOCYTES: 0.5 10*3/uL (ref 0.0–1.0)
ABS. NEUTROPHILS: 14.5 10*3/uL — ABNORMAL HIGH (ref 1.8–8.0)
BASOPHILS: 0 % (ref 0–1)
EOSINOPHILS: 0 % (ref 0–7)
HCT: 39.3 % (ref 36.6–50.3)
HGB: 13.7 g/dL (ref 12.1–17.0)
LYMPHOCYTES: 6 % — ABNORMAL LOW (ref 12–49)
MCH: 30.1 PG (ref 26.0–34.0)
MCHC: 34.9 g/dL (ref 30.0–36.5)
MCV: 86.4 FL (ref 80.0–99.0)
MONOCYTES: 3 % — ABNORMAL LOW (ref 5–13)
NEUTROPHILS: 91 % — ABNORMAL HIGH (ref 32–75)
PLATELET: 240 10*3/uL (ref 150–400)
RBC: 4.55 M/uL (ref 4.10–5.70)
RDW: 12.7 % (ref 11.5–14.5)
WBC: 15.9 10*3/uL — ABNORMAL HIGH (ref 4.1–11.1)

## 2016-05-25 LAB — URINALYSIS W/ REFLEX CULTURE
Bacteria: NEGATIVE /hpf
Bilirubin: NEGATIVE
Glucose: NEGATIVE mg/dL
Ketone: NEGATIVE mg/dL
Leukocyte Esterase: NEGATIVE
Nitrites: NEGATIVE
Protein: 30 mg/dL — AB
RBC: >100 /hpf — ABNORMAL HIGH (ref 0–5)
Specific gravity: 1.018 (ref 1.003–1.030)
Urobilinogen: 0.2 EU/dL (ref 0.2–1.0)
pH (UA): 7 (ref 5.0–8.0)

## 2016-05-25 LAB — METABOLIC PANEL, COMPREHENSIVE
A-G Ratio: 1.2 (ref 1.1–2.2)
ALT (SGPT): 95 U/L — ABNORMAL HIGH (ref 12–78)
AST (SGOT): 61 U/L — ABNORMAL HIGH (ref 15–37)
Albumin: 4.6 g/dL (ref 3.5–5.0)
Alk. phosphatase: 90 U/L (ref 45–117)
Anion gap: 7 mmol/L (ref 5–15)
BUN/Creatinine ratio: 13 (ref 12–20)
BUN: 18 MG/DL (ref 6–20)
Bilirubin, total: 0.4 MG/DL (ref 0.2–1.0)
CO2: 26 mmol/L (ref 21–32)
Calcium: 8.7 MG/DL (ref 8.5–10.1)
Chloride: 106 mmol/L (ref 97–108)
Creatinine: 1.34 MG/DL — ABNORMAL HIGH (ref 0.70–1.30)
GFR est AA: >60 mL/min/{1.73_m2} (ref >60)
GFR est non-AA: 57 mL/min/{1.73_m2} — ABNORMAL LOW (ref >60)
Globulin: 4 g/dL (ref 2.0–4.0)
Glucose: 116 mg/dL — ABNORMAL HIGH (ref 65–100)
Potassium: 3.6 mmol/L (ref 3.5–5.1)
Protein, total: 8.6 g/dL — ABNORMAL HIGH (ref 6.4–8.2)
Sodium: 139 mmol/L (ref 136–145)

## 2016-05-25 MED ORDER — ONDANSETRON 4 MG TAB, RAPID DISSOLVE
4 mg | ORAL_TABLET | Freq: Three times a day (TID) | ORAL | 0 refills | Status: AC | PRN
Start: 2016-05-25 — End: ?

## 2016-05-25 MED ORDER — HYDROMORPHONE (PF) 1 MG/ML IJ SOLN
1 mg/mL | INTRAMUSCULAR | Status: AC
Start: 2016-05-25 — End: 2016-05-24
  Administered 2016-05-25: 01:00:00 via INTRAVENOUS

## 2016-05-25 MED ORDER — IBUPROFEN 800 MG TAB
800 mg | ORAL_TABLET | Freq: Three times a day (TID) | ORAL | 0 refills | Status: AC | PRN
Start: 2016-05-25 — End: 2016-05-31

## 2016-05-25 MED ORDER — OXYCODONE-ACETAMINOPHEN 5 MG-325 MG TAB
5-325 mg | ORAL_TABLET | ORAL | 0 refills | Status: AC | PRN
Start: 2016-05-25 — End: ?

## 2016-05-25 MED ORDER — KETOROLAC TROMETHAMINE 30 MG/ML INJECTION
30 mg/mL (1 mL) | INTRAMUSCULAR | Status: AC
Start: 2016-05-25 — End: 2016-05-24
  Administered 2016-05-25: 01:00:00 via INTRAVENOUS

## 2016-05-25 MED ORDER — SODIUM CHLORIDE 0.9% BOLUS IV
0.9 % | Freq: Once | INTRAVENOUS | Status: AC
Start: 2016-05-25 — End: 2016-05-24
  Administered 2016-05-25: 01:00:00 via INTRAVENOUS

## 2016-05-25 MED ORDER — ONDANSETRON (PF) 4 MG/2 ML INJECTION
4 mg/2 mL | INTRAMUSCULAR | Status: AC
Start: 2016-05-25 — End: 2016-05-24
  Administered 2016-05-25: 01:00:00 via INTRAVENOUS

## 2016-05-25 MED ORDER — TAMSULOSIN SR 0.4 MG 24 HR CAP
0.4 mg | ORAL_CAPSULE | Freq: Every day | ORAL | 0 refills | Status: AC
Start: 2016-05-25 — End: 2016-05-31

## 2016-05-25 MED FILL — HYDROMORPHONE (PF) 1 MG/ML IJ SOLN: 1 mg/mL | INTRAMUSCULAR | Qty: 1

## 2016-05-25 MED FILL — ONDANSETRON (PF) 4 MG/2 ML INJECTION: 4 mg/2 mL | INTRAMUSCULAR | Qty: 2

## 2016-05-25 MED FILL — SODIUM CHLORIDE 0.9 % IV: INTRAVENOUS | Qty: 1000

## 2016-05-25 MED FILL — KETOROLAC TROMETHAMINE 30 MG/ML INJECTION: 30 mg/mL (1 mL) | INTRAMUSCULAR | Qty: 1
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
## 2016-09-15 IMAGING — CR DG ANKLE COMPLETE 3+V*L*
1 series · 2 of 2 positions shown · non-contrast
Comparison: None.

CLINICAL DATA: Initial evaluation for acute trauma.

EXAM:
LEFT ANKLE COMPLETE - 3+ VIEW

[Series 2: lat · 0.17mm/px · 2 of 2 slices shown]
[im 1/2]
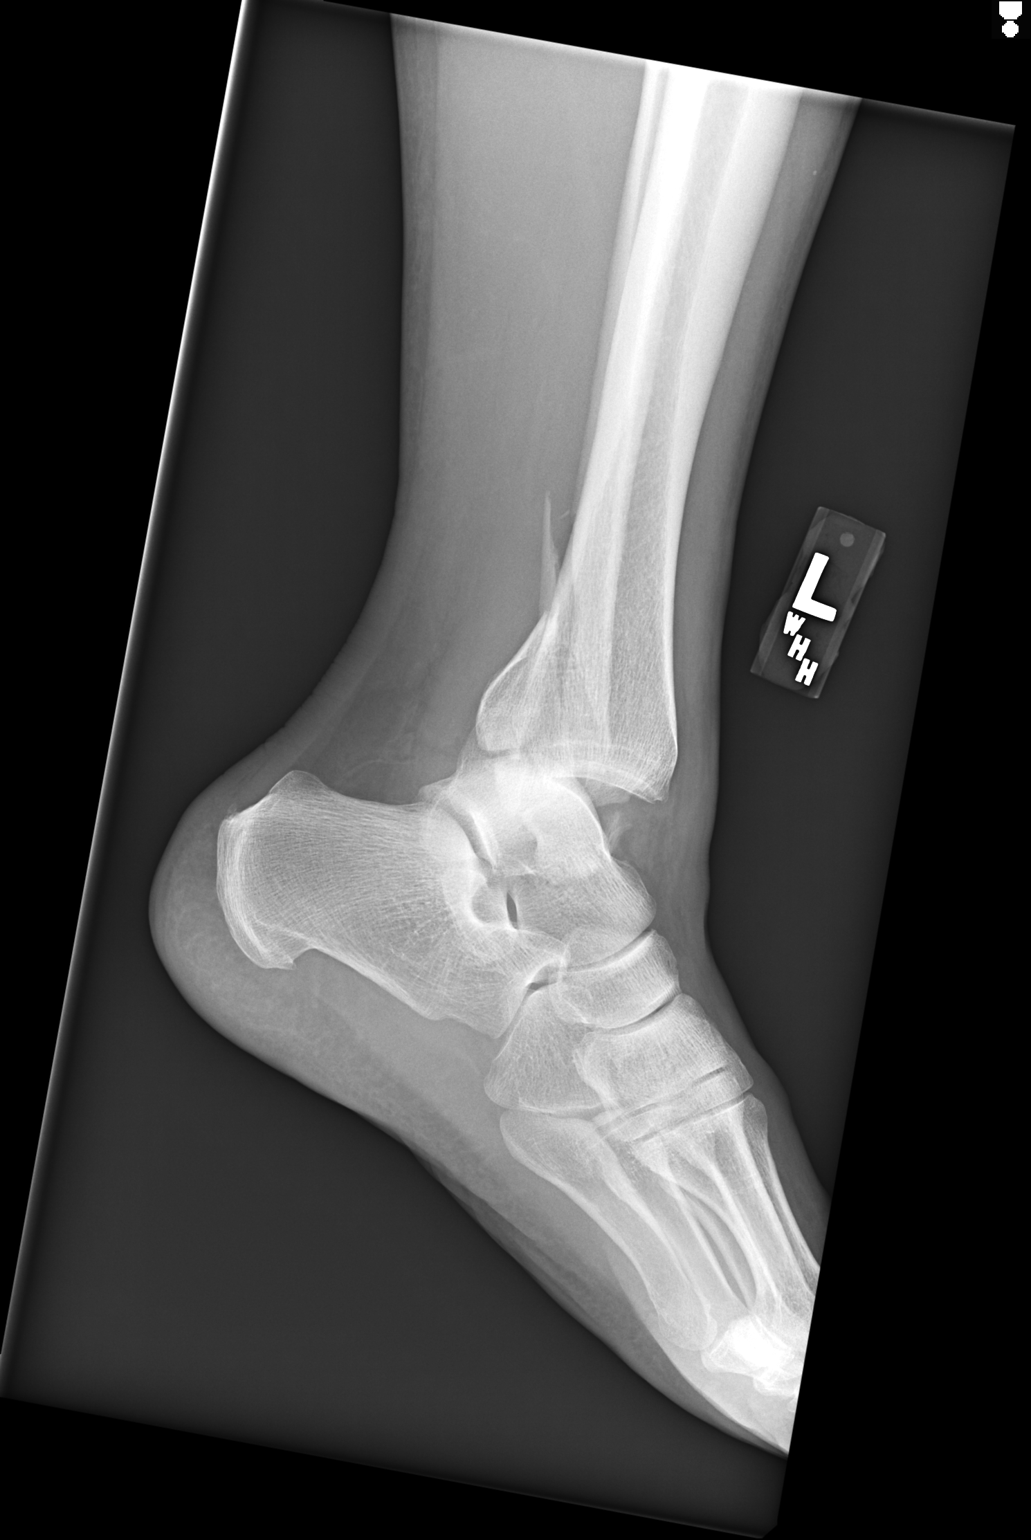
[im 2/2]
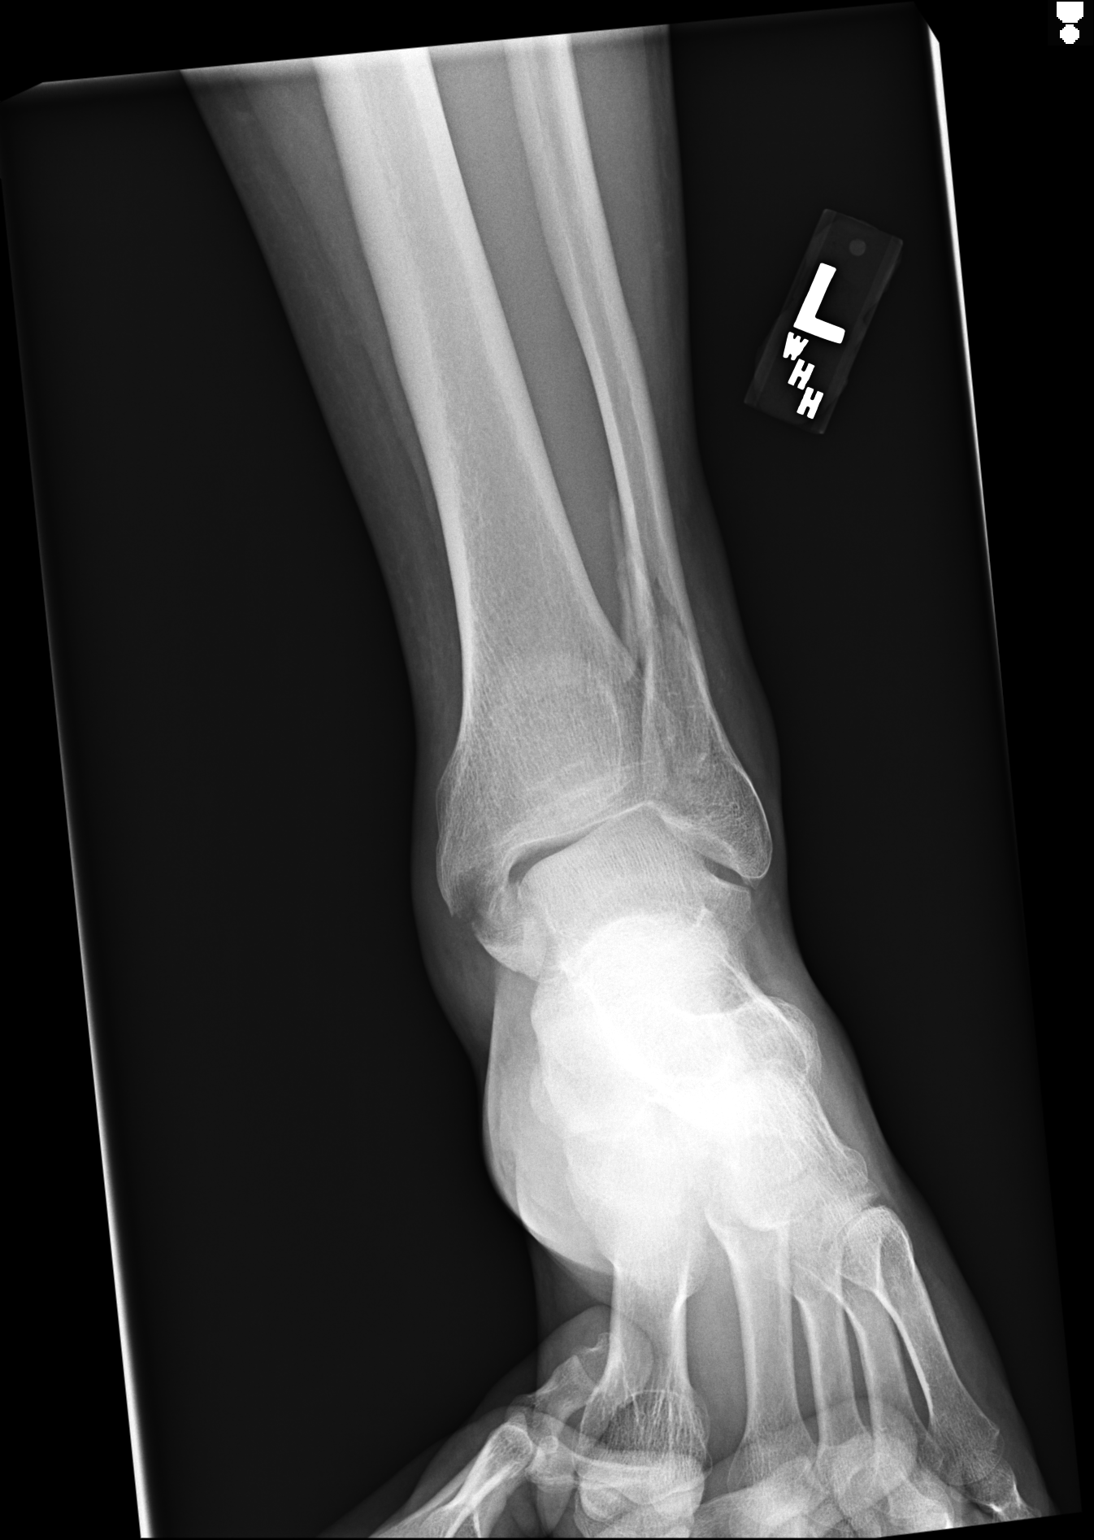

[2 of 2 positions shown; findings below may reference images not displayed]

FINDINGS: There is an acute comminuted fracture of the distal left fibular
shaft with posterior displacement. There is an additional acute
nondisplaced fracture of the posterior tubercle. Acute minimally
displaced fracture of the medial malleoli is. Distal tibial plafond
is subluxed anteriorly relative to the talar dome. Talar dome itself
is intact. Diffuse soft tissue swelling about the ankle.

Small posterior plantar calcaneal enthesophytes noted.
IMPRESSION: Acute fracture dislocation of the ankle as above.

## 2023-08-16 NOTE — Progress Notes (Signed)
 Subjective:    Patient ID: Ricky Kennedy is a 53 y.o. male.  Chief Complaint:  53 yo male PT HERE FOR NEW ONSET OF SWELLING FROM 1]  INGROWING HAIR.   HE ENDORSES GETTING THEM AT THIS SAME LOCATOIN IN THE PAST, AND USUALLY THEY SELF RESOLVE.  THIS HAS BEEN GOING ON FOR OVER A WEEK.       2]  ALSO HAS EXTREME HTN  .   HE DID NOT BRING UP, BUT I MENTIONED, AND RESPONSE I HAVE HEARD THAT BEFORE  HE WAS COUNCELLED AS TO THE DANGER OF HIS NUMBERS ALTHOUGH HE MAY NOT BE HAVING SX'S, AND REMINDED HIM OF THE TERM 'SILENT KILLER'.  PT LISTENED AND AGREED.     ALSO EXPLAINED THE NEED FOR BLOOD WORK TO CHECK METABOLISM.       NAMES OFFERED FOR HIM TO FOLLOW WITH.     Foreign Body in Skin This is a new problem. The current episode started 1 to 4 weeks ago. The problem occurs constantly. The problem has been gradually worsening. Associated symptoms include neck pain. Pertinent negatives include no abdominal pain, chest pain, chills, coughing, fatigue, joint swelling, rash or weakness. Exacerbated by: HIS PICKING AT IT. Treatments tried: HIS PICKING AT IT. The treatment provided no relief.    No past medical history on file.  No past surgical history on file.  No family history on file.     Review of Systems  Constitutional: Negative.  Negative for chills and fatigue.  HENT: Negative.    Eyes: Negative.   Respiratory: Negative.  Negative for cough.   Cardiovascular: Negative.  Negative for chest pain.  Gastrointestinal: Negative.  Negative for abdominal pain.  Genitourinary: Negative.   Musculoskeletal:  Positive for neck pain. Negative for joint swelling.  Skin:  Positive for color change and wound. Negative for rash.  Neurological: Negative.  Negative for weakness.  Psychiatric/Behavioral: Negative.      Objective:   BP (!) 213/113 (BP Location: Left Upper Arm, Patient Position: Sitting)   Pulse 83   Temp 97.9 F (36.6 C) (Tympanic)   Resp 17   Ht 5' 10 (1.778 m)   Wt 215 lb (97.5  kg)   SpO2 95%   BMI 30.85 kg/m   Physical Exam Vitals and nursing note reviewed.  Constitutional:      General: He is not in acute distress.    Appearance: Normal appearance.  HENT:     Head: Normocephalic and atraumatic.     Right Ear: External ear normal.     Left Ear: External ear normal.     Nose: Nose normal.  Eyes:     Extraocular Movements: Extraocular movements intact.     Conjunctiva/sclera: Conjunctivae normal.  Cardiovascular:     Rate and Rhythm: Normal rate and regular rhythm.     Pulses: Normal pulses.     Heart sounds: Normal heart sounds.  Musculoskeletal:        General: Normal range of motion.     Cervical back: Normal range of motion.  Pulmonary:     Effort: Pulmonary effort is normal. No respiratory distress.     Breath sounds: Normal breath sounds.  Abdominal:     Tenderness: There is no right CVA tenderness or left CVA tenderness.  Skin:    General: Skin is warm and dry.     Comments: LEFT OCCIPITAL AREA OF POSTERIOR SCALP -   10 CM AREA OF EREYTEHMA W/ MILD EDEMA, CENTRAL FISTULA THAT IS CRUSTED  WITH HONEY COLORED CRUST.    TENDER TO PALP BUT DID NOT SEEM FLUNTUANT.     Neurological:     General: No focal deficit present.     Mental Status: He is alert and oriented to person, place, and time.     Cranial Nerves: No cranial nerve deficit.  Psychiatric:        Mood and Affect: Mood normal.        Behavior: Behavior normal.     Comments: THOUGHT CONTENT REGARDING HIS BLOOD PRESSURE WAS MOSTLY 'DENIAL'  NICE BUT POOR JUDGEMENT      Assessment:   The primary encounter diagnosis was Cellulitis and abscess of head. A diagnosis of Ingrowing hair was also pertinent to this visit.  Plan:   -  IN ADDITION, PT WILL USE HOT COMPRESSES ON THE ABSCESS IN THE SCALP TO ENCOURAGE DRAINAGE

## 2024-04-17 ENCOUNTER — Emergency Department (HOSPITAL_COMMUNITY)
Admission: EM | Admit: 2024-04-17 | Discharge: 2024-04-17 | Disposition: A | Discharge status: Home / Self Care | Attending: Emergency Medicine | Admitting: Emergency Medicine

## 2024-04-17 ENCOUNTER — Other Ambulatory Visit: Payer: Self-pay

## 2024-04-17 ENCOUNTER — Emergency Department (HOSPITAL_COMMUNITY)

## 2024-04-17 ENCOUNTER — Encounter (HOSPITAL_COMMUNITY): Payer: Self-pay

## 2024-04-17 ENCOUNTER — Ambulatory Visit: Payer: Self-pay

## 2024-04-17 DIAGNOSIS — K760 Fatty (change of) liver, not elsewhere classified: Secondary | ICD-10-CM | POA: Insufficient documentation

## 2024-04-17 DIAGNOSIS — R739 Hyperglycemia, unspecified: Secondary | ICD-10-CM | POA: Diagnosis not present

## 2024-04-17 DIAGNOSIS — K429 Umbilical hernia without obstruction or gangrene: Secondary | ICD-10-CM | POA: Insufficient documentation

## 2024-04-17 DIAGNOSIS — R1033 Periumbilical pain: Secondary | ICD-10-CM | POA: Diagnosis present

## 2024-04-17 DIAGNOSIS — N200 Calculus of kidney: Secondary | ICD-10-CM

## 2024-04-17 LAB — CBC WITH DIFFERENTIAL/PLATELET
Abs Immature Granulocytes: 0.02 K/uL (ref 0.00–0.07)
Basophils Absolute: 0.1 K/uL (ref 0.0–0.1)
Basophils Relative: 1 %
Eosinophils Absolute: 0.2 K/uL (ref 0.0–0.5)
Eosinophils Relative: 2 %
HCT: 44 % (ref 39.0–52.0)
Hemoglobin: 15.5 g/dL (ref 13.0–17.0)
Immature Granulocytes: 0 %
Lymphocytes Relative: 24 %
Lymphs Abs: 2.4 K/uL (ref 0.7–4.0)
MCH: 30.2 pg (ref 26.0–34.0)
MCHC: 35.2 g/dL (ref 30.0–36.0)
MCV: 85.6 fL (ref 80.0–100.0)
Monocytes Absolute: 0.6 K/uL (ref 0.1–1.0)
Monocytes Relative: 6 %
Neutro Abs: 6.6 K/uL (ref 1.7–7.7)
Neutrophils Relative %: 67 %
Platelets: 267 K/uL (ref 150–400)
RBC: 5.14 MIL/uL (ref 4.22–5.81)
RDW: 12.4 % (ref 11.5–15.5)
WBC: 9.8 K/uL (ref 4.0–10.5)
nRBC: 0 % (ref 0.0–0.2)

## 2024-04-17 LAB — URINALYSIS, ROUTINE W REFLEX MICROSCOPIC
Bacteria, UA: NONE SEEN
Bilirubin Urine: NEGATIVE
Glucose, UA: 50 mg/dL — AB
Hgb urine dipstick: NEGATIVE
Ketones, ur: NEGATIVE mg/dL
Leukocytes,Ua: NEGATIVE
Nitrite: NEGATIVE
Protein, ur: 100 mg/dL — AB
Specific Gravity, Urine: 1.025 (ref 1.005–1.030)
pH: 5 (ref 5.0–8.0)

## 2024-04-17 LAB — COMPREHENSIVE METABOLIC PANEL WITH GFR
ALT: 46 U/L — ABNORMAL HIGH (ref 0–44)
AST: 38 U/L (ref 15–41)
Albumin: 4.6 g/dL (ref 3.5–5.0)
Alkaline Phosphatase: 86 U/L (ref 38–126)
Anion gap: 13 (ref 5–15)
BUN: 12 mg/dL (ref 6–20)
CO2: 24 mmol/L (ref 22–32)
Calcium: 9.7 mg/dL (ref 8.9–10.3)
Chloride: 100 mmol/L (ref 98–111)
Creatinine, Ser: 1.1 mg/dL (ref 0.61–1.24)
GFR, Estimated: >60 mL/min (ref >60)
Glucose, Bld: 174 mg/dL — ABNORMAL HIGH (ref 70–99)
Potassium: 3.8 mmol/L (ref 3.5–5.1)
Sodium: 138 mmol/L (ref 135–145)
Total Bilirubin: 1.1 mg/dL (ref 0.0–1.2)
Total Protein: 8.4 g/dL — ABNORMAL HIGH (ref 6.5–8.1)

## 2024-04-17 LAB — LIPASE, BLOOD: Lipase: 88 U/L — ABNORMAL HIGH (ref 11–51)

## 2024-04-17 MED ORDER — IOHEXOL 300 MG/ML  SOLN
100.0000 mL | Freq: Once | INTRAMUSCULAR | Status: AC | PRN
  Administered 2024-04-17: 100 mL via INTRAVENOUS

## 2024-04-17 NOTE — ED Provider Triage Note (Signed)
 Emergency Medicine Provider Triage Evaluation Note  Ricky Kennedy , a 53 y.o. male  was evaluated in triage.  Pt complains of periumbilical abdominal pain around 2-3 times a month every month for the past year.  No nausea, vomiting, diarrhea, constipation.  Describes the pain as sharp.  Still passing gas.  Does not follow-up with a PCP.  No recent colonoscopy.  Review of Systems  Positive:  Negative:   Physical Exam  BP (!) 195/106 (BP Location: Left Arm)   Pulse 69   Temp 97.8 F (36.6 C) (Oral)   Resp 16   SpO2 96%  Gen:   Awake, no distress   Resp:  Normal effort  MSK:   Moves extremities without difficulty  Other:  Abdomen soft. Does have a soft periumbilical hernia, easily reducible.   Medical Decision Making  Medically screening exam initiated at 12:02 PM.  Appropriate orders placed.  Nyjah Red was informed that the remainder of the evaluation will be completed by another provider, this initial triage assessment does not replace that evaluation, and the importance of remaining in the ED until their evaluation is complete.  CT and labs ordered.    Bernis Ernst, PA-C 04/17/24 1204

## 2024-04-17 NOTE — ED Triage Notes (Signed)
 Chief Complaint  Patient presents with   Abdominal Pain    Pt reports that his wife made him come to get evaluated. He reports abdominal pain 2-3 times per month. Today he only reports tenderness and denies pain. Denies N/V/D.

## 2024-04-17 NOTE — ED Provider Notes (Signed)
 [place] EMERGENCY DEPARTMENT AT [facility] Provider Note   CSN: [id number] Arrival date & time: 04/17/24  1013     Patient presents with: Abdominal Pain (Pt reports that his wife made him come to get evaluated. He reports abdominal pain 2-3 times per month. Today he only reports tenderness and denies pain. Denies N/V/D. )   Ricky Kennedy is a 53 y.o. male presents to the ER today for evaluation of his periumbilical abdominal pain around 2-3 times a month every month for the past year.  No pain now. No nausea, vomiting, diarrhea, constipation.  Denies any urinary symptoms.  Describes the pain as sharp.  Still passing gas.  Does not follow-up with a PCP.  No recent colonoscopy. He reports that his wife was bothering him about it so he came into the ER.    Abdominal Pain Associated symptoms: no chest pain, no chills, no constipation, no diarrhea, no dysuria, no fever, no hematuria, no nausea, no shortness of breath and no vomiting        Prior to Admission medications   Medication Sig Start Date End Date Taking? Authorizing Provider  naproxen  (NAPROSYN ) 500 MG tablet Take 1 po BID with food prn pain 02/26/15   Knapp, Iva, MD  oxyCODONE -acetaminophen  (PERCOCET/ROXICET) 5-325 MG per tablet Take 1 tablet by mouth every 4 (four) hours as needed for severe pain. 02/26/15   Knapp, Iva, MD    Allergies: Codeine    Review of Systems  Constitutional:  Negative for chills and fever.  Respiratory:  Negative for shortness of breath.   Cardiovascular:  Negative for chest pain.  Gastrointestinal:  Positive for abdominal pain. Negative for constipation, diarrhea, nausea and vomiting.  Genitourinary:  Negative for dysuria and hematuria.    Updated Vital Signs BP (!) 130/90 (BP Location: Right Arm)   Pulse 88   Temp 98 F (36.7 C) (Oral)   Resp 18   SpO2 100%   Physical Exam Vitals and nursing note reviewed.  Constitutional:      General: He is not in acute distress.     Appearance: He is not ill-appearing or toxic-appearing.  Cardiovascular:     Rate and Rhythm: Normal rate.  Pulmonary:     Effort: Pulmonary effort is normal. No respiratory distress.  Abdominal:     General: There is no distension.     Palpations: Abdomen is soft.     Tenderness: There is no abdominal tenderness.     Hernia: A hernia is present. Hernia is present in the umbilical area.     Comments: Soft, reducible umbilical hernia.  No overlying skin changes noted.  Abdomen is soft and nontender.  Skin:    General: Skin is warm and dry.  Neurological:     Mental Status: He is alert.     (all labs ordered are listed, but only abnormal results are displayed) Labs Reviewed  COMPREHENSIVE METABOLIC PANEL WITH GFR - Abnormal; Notable for the following components:      Result Value   Glucose, Bld 174 (*)    Total Protein 8.4 (*)    ALT 46 (*)    All other components within normal limits  LIPASE, BLOOD - Abnormal; Notable for the following components:   Lipase 88 (*)    All other components within normal limits  URINALYSIS, ROUTINE W REFLEX MICROSCOPIC - Abnormal; Notable for the following components:   Color, Urine AMBER (*)    Glucose, UA 50 (*)    Protein, ur  100 (*)    All other components within normal limits  CBC WITH DIFFERENTIAL/PLATELET    EKG: None  Radiology: CT ABDOMEN PELVIS W CONTRAST Addendum Date: 04/17/2024 ** ADDENDUM #1** ADDENDUM: A 9 mm paraumbilical hernia contains fat without bowel. There is some stranding of the fat which may suggest inflammation. ---------------------------------------------------- Electronically signed by: Lonni Necessary MD 04/17/2024 01:44 PM EDT RP Workstation: [id number]   Result Date: 04/17/2024 **ORIGINAL REPORT **EXAM: CT ABDOMEN AND PELVIS WITH CONTRAST 04/17/2024 12:54:57 PM TECHNIQUE: CT of the abdomen and pelvis was performed with the administration of intravenous contrast. Multiplanar reformatted images are provided for  review. Automated exposure control, iterative reconstruction, and/or weight-based adjustment of the mA/kV was utilized to reduce the radiation dose to as low as reasonably achievable. COMPARISON: None available. CLINICAL HISTORY: Abdominal pain, acute, nonlocalized; concern for umbilical hernia. Ricky Kennedy, a 53 y.o. male, was evaluated in triage. Pt complains of periumbilical abdominal pain around 2-3 times a month every month for the past year. No nausea, vomiting, diarrhea, constipation. Describes the pain as sharp. Still passing gas. Does not follow-up with a PCP. No recent colonoscopy. FINDINGS: LOWER CHEST: No acute abnormality. LIVER: Diffuse fatty infiltration of the liver is noted. No focal lesions are present. GALLBLADDER AND BILE DUCTS: Gallbladder is unremarkable. No biliary ductal dilatation. SPLEEN: No acute abnormality. PANCREAS: No acute abnormality. ADRENAL GLANDS: No acute abnormality. KIDNEYS, URETERS AND BLADDER: 3 nonobstructing stones are present at the lower pole of the left kidney. The largest measures 4 mm. An additional punctate interpolar stone is present on image 37. A 3 mm nonobstructing stone is present at the upper pole of the right kidney. 2 smaller nonobstructing stones are present at the lower pole of the right kidney. Ureters and collecting system are within normal limits bilaterally. GI AND BOWEL: Stomach demonstrates no acute abnormality. There is no bowel obstruction. PERITONEUM AND RETROPERITONEUM: No ascites. No free air. VASCULATURE: Atherosclerotic calcifications are present in the aorta and branch vessels without aneurysm. LYMPH NODES: No lymphadenopathy. REPRODUCTIVE ORGANS: No acute abnormality. BONES AND SOFT TISSUES: No acute osseous abnormality. No focal soft tissue abnormality. IMPRESSION: 1. No acute findings in the abdomen or pelvis. 2. Diffuse hepatic steatosis. 3. Multiple nonobstructing renal stones bilaterally, largest 4 mm in the left lower pole.  Electronically signed by: Lonni Necessary MD 04/17/2024 01:26 PM EDT RP Workstation: [id number]   Procedures   Medications Ordered in the ED  iohexol  (OMNIPAQUE ) 300 MG/ML solution 100 mL (100 mLs Intravenous Contrast Given 04/17/24 1244)                               Medical Decision Making Amount and/or Complexity of Data Reviewed Labs: ordered. Radiology: ordered.  Risk Prescription drug management.   53 y.o. male presents to the ER for evaluation of umbilical abdominal pain intermittent for a month, none now. Differential diagnosis includes but is not limited to AAA, mesenteric ischemia, appendicitis, diverticulitis, DKA, gastroenteritis, nephrolithiasis, pancreatitis, constipation, UTI, bowel obstruction, biliary disease, IBD, PUD, hepatitis. Vital signs elevated BP otherwise unremarkable. Physical exam as noted above.   Patient is not having any pain currently.  He does have a soft reducible umbilical hernia.  He might be experiencing some intermittent pain from this.  Will obtain CT scan and labs.  I independently reviewed and interpreted the patient's labs.  CBC without leukocytosis or anemia.  CMP does show glucose of 174, total protein of 8.4, and ALT  of 46.  Lipase is elevated at 88.  Urinalysis shows amber urine with 50 glucose and 100 protein.  There is some white blood cells seen but no bacteria, leukocytes, or nitrates positive.  Is not having any urinary symptoms.  CT scan 1. No acute findings in the abdomen or pelvis. 2. Diffuse hepatic steatosis. 3. Multiple nonobstructing renal stones bilaterally, largest 4 mm in the left lower pole. A 9 mm paraumbilical hernia contains fat without bowel. There is some stranding of the fat which may suggest inflammation.  Patient does have some elevated glucose as well as a small glucose present in the urine.  He has not followed up with his PCP in years.  Discussed the hypoglycemia and need for follow-up with PCP to rule out any  diabetes or prediabetes.  Dr. Sheldon reviewed image and recommends outpatient follow up. His lipase is elevated, but he is not having abdominal pain. He has no RUQ tenderness to palpation. He reports he drinks once maybe every few months. Will need follow up with PCP regarding this. He was given the information for [facility].  I did place a TOC consult for PCP needs.  Also encourage patient to call the number on the back of his insurance card to schedule an appointment.  We discussed return precautions.  Stable for discharge home  We discussed the results of the labs/imaging. The plan is follow-up with general surgery, follow-up PCP. We discussed strict return precautions and red flag symptoms. The patient verbalized their understanding and agrees to the plan. The patient is stable and being discharged home in good condition.  Portions of this report may have been transcribed using voice recognition software. Every effort was made to ensure accuracy; however, inadvertent computerized transcription errors may be present.    Final diagnoses:  Umbilical hernia without obstruction and without gangrene  Hyperglycemia    ED Discharge Orders     None          Bernis Ernst, DEVONNA 04/17/24 2011    Dasie Faden, MD 04/18/24 [phone]

## 2024-04-17 NOTE — Discharge Instructions (Signed)
 You were seen in the emerged from today for evaluation of your intermittent abdominal pain.  I think this is likely from a hernia.  This does not need emergent repair however you may want this repaired in the future.  I have included information for [place] surgery for you to call to set an appointment with for follow-up.  Additionally, your blood sugar is high and you do have some sugar in your urine, you will need to follow-up with your primary care doctor regarding this.  I have placed a consult to our social worker to establish you with a team however you can call the number on the back of your insurance card to establish an appointment yourself.  It is important that you follow-up with this to make sure you do not have diabetes will need continued treatment.  I have included some more information for you to review in your discharge paperwork.  If you have any concerns for new or worsening symptoms, please return to your nearest emergency department for reevaluation.  Contact a health care provider if: You get new pain, swelling, or redness near your hernia. You have trouble pooping. Your poop is harder or larger than normal. You have a fever or chills. You have nausea or vomiting. Your hernia can't be pushed in. Get help right away if: You have belly pain that gets worse. Your hernia: Changes in shape or size. Changes color. Feels hard or hurts when you touch it. These symptoms may be an emergency. Call 911 right away. Do not wait to see if the symptoms will go away. Do not drive yourself to the hospital.
